# Patient Record
Sex: Male | Born: 1995 | Race: White | Hispanic: No | Marital: Single | Smoking: Never smoker
Health system: Southern US, Community
[De-identification: ages and names within clinical notes are randomized; demographics above are authoritative.]

## PROBLEM LIST (undated history)

## (undated) DIAGNOSIS — S62109A Fracture of unspecified carpal bone, unspecified wrist, initial encounter for closed fracture: Secondary | ICD-10-CM

---
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
## 2014-02-16 ENCOUNTER — Encounter (HOSPITAL_COMMUNITY): Payer: Self-pay | Admitting: Emergency Medicine

## 2014-02-16 ENCOUNTER — Emergency Department (HOSPITAL_COMMUNITY)
Admission: EM | Admit: 2014-02-16 | Discharge: 2014-02-17 | Disposition: A | Discharge status: Home / Self Care | Payer: Managed Care, Other (non HMO) | Attending: Emergency Medicine | Admitting: Emergency Medicine

## 2014-02-16 DIAGNOSIS — S3022XA Contusion of scrotum and testes, initial encounter: Secondary | ICD-10-CM

## 2014-02-16 DIAGNOSIS — N501 Vascular disorders of male genital organs: Secondary | ICD-10-CM | POA: Insufficient documentation

## 2014-02-16 DIAGNOSIS — Z791 Long term (current) use of non-steroidal anti-inflammatories (NSAID): Secondary | ICD-10-CM | POA: Insufficient documentation

## 2014-02-16 DIAGNOSIS — Z87828 Personal history of other (healed) physical injury and trauma: Secondary | ICD-10-CM | POA: Insufficient documentation

## 2014-02-16 NOTE — ED Notes (Signed)
Pt reports having a bruise to scrotum he noticed 4 days ago. Pt denies any injury & no urinary symptoms.

## 2014-02-16 NOTE — ED Provider Notes (Signed)
CSN: [id number]     Arrival date & time 02/16/14  2339 History   This chart was scribed for Geoffery Lyons, MD, by Yevette Edwards, ED Scribe. This patient was seen in room APA18/APA18 and the patient's care was started at 11:59 PM.  First MD Initiated Contact with Patient 02/16/14 2355     Chief Complaint  Patient presents with  . Groin Swelling   The history is provided by the patient. No language interpreter was used.   HPI Comments: Isaac Ortiz is a 18 y.o. male who presents to the Emergency Department complaining of a bruise to his testicle which was first noticed three days ago. He denies falls or injuries. He denies a h/o similar symptoms. He denies dysuria, testicular pain, or scrotal pain.  The pt visited his "back doctor" yesterday for the same symptoms without resolution; the pt takes Vicodin and naproxen for back pain. He lifts frequently at work.   Past Medical History  Diagnosis Date  . Broken wrist    History reviewed. No pertinent past surgical history. No family history on file. History  Substance Use Topics  . Smoking status: Never Smoker   . Smokeless tobacco: Not on file  . Alcohol Use: No    Review of Systems  Genitourinary: Positive for scrotal swelling. Negative for penile pain and testicular pain.  All other systems reviewed and are negative.    Allergies  Review of patient's allergies indicates no known allergies.  Home Medications   Current Outpatient Rx  Name  Route  Sig  Dispense  Refill  . HYDROcodone-acetaminophen (NORCO/VICODIN) 5-325 MG per tablet   Oral   Take 1 tablet by mouth every 4 (four) hours as needed for moderate pain.         . naproxen (NAPROSYN) 250 MG tablet   Oral   Take by mouth 2 (two) times daily with a meal.          Triage Vitals:  There were no vitals taken for this visit. Physical Exam  Nursing note and vitals reviewed. Constitutional: He is oriented to person, place, and time. He appears well-developed and  well-nourished. No distress.  HENT:  Head: Normocephalic and atraumatic.  Eyes: EOM are normal.  Neck: Neck supple. No tracheal deviation present.  Cardiovascular: Normal rate.   Pulmonary/Chest: Effort normal. No respiratory distress.  Genitourinary:  Testicles normal. Not twisted or torsed. The testicles are freely mobile.  The scrotum is noted to have an ecchymotic area on the anterior aspect. There is no significant swelling.   Musculoskeletal: Normal range of motion.  Neurological: He is alert and oriented to person, place, and time.  Skin: Skin is warm and dry.  Psychiatric: He has a normal mood and affect. His behavior is normal.    ED Course  Procedures (including critical care time)  DIAGNOSTIC STUDIES: COORDINATION OF CARE:  12:04 AM- Discussed treatment plan with patient, which includes a follow-up for an ultra-sound, and the patient agreed to the plan.   Labs Review Labs Reviewed - No data to display Imaging Review No results found.    MDM   Final diagnoses:  None    Patient presents with a bruise to his scrotum in the absence of any injury or trauma. There is no evidence for testicular torsion or hernia or other emergent process.. The genitalia exam is unremarkable with the exception of an ecchymotic area on the anterior aspect of the scrotum. Will set up an ultrasound as an outpatient. He  is to followup when necessary for any problems.    I personally performed the services described in this documentation, which was scribed in my presence. The recorded information has been reviewed and is accurate.    Geoffery Lyonsouglas Ellen Goris, MD 02/17/14 [phone]

## 2014-02-17 ENCOUNTER — Ambulatory Visit (HOSPITAL_COMMUNITY): Admit: 2014-02-17 | Payer: Managed Care, Other (non HMO)

## 2014-02-17 NOTE — Discharge Instructions (Signed)
Return to Radiology for an ultrasound at the arranged time.    Return to the ER if you develop severe pain, fever, or increased swelling.   Hematoma A hematoma is a collection of blood under the skin, in an organ, in a body space, in a joint space, or in other tissue. The blood can clot to form a lump that you can see and feel. The lump is often firm and may sometimes become sore and tender. Most hematomas get better in a few days to weeks. However, some hematomas may be serious and require medical care. Hematomas can range in size from very small to very large. CAUSES  A hematoma can be caused by a blunt or penetrating injury. It can also be caused by spontaneous leakage from a blood vessel under the skin. Spontaneous leakage from a blood vessel is more likely to occur in older people, especially those taking blood thinners. Sometimes, a hematoma can develop after certain medical procedures. SIGNS AND SYMPTOMS   A firm lump on the body.  Possible pain and tenderness in the area.  Bruising.Blue, dark blue, purple-red, or yellowish skin may appear at the site of the hematoma if the hematoma is close to the surface of the skin. For hematomas in deeper tissues or body spaces, the signs and symptoms may be subtle. For example, an intra-abdominal hematoma may cause abdominal pain, weakness, fainting, and shortness of breath. An intracranial hematoma may cause a headache or symptoms such as weakness, trouble speaking, or a change in consciousness. DIAGNOSIS  A hematoma can usually be diagnosed based on your medical history and a physical exam. Imaging tests may be needed if your health care provider suspects a hematoma in deeper tissues or body spaces, such as the abdomen, head, or chest. These tests may include ultrasonography or a CT scan.  TREATMENT  Hematomas usually go away on their own over time. Rarely does the blood need to be drained out of the body. Large hematomas or those that may affect  vital organs will sometimes need surgical drainage or monitoring. HOME CARE INSTRUCTIONS   Apply ice to the injured area:   Put ice in a plastic bag.   Place a towel between your skin and the bag.   Leave the ice on for 20 minutes, 2 3 times a day for the first 1 to 2 days.   After the first 2 days, switch to using warm compresses on the hematoma.   Elevate the injured area to help decrease pain and swelling. Wrapping the area with an elastic bandage may also be helpful. Compression helps to reduce swelling and promotes shrinking of the hematoma. Make sure the bandage is not wrapped too tight.   If your hematoma is on a lower extremity and is painful, crutches may be helpful for a couple days.   Only take over-the-counter or prescription medicines as directed by your health care provider. SEEK IMMEDIATE MEDICAL CARE IF:   You have increasing pain, or your pain is not controlled with medicine.   You have a fever.   You have worsening swelling or discoloration.   Your skin over the hematoma breaks or starts bleeding.   Your hematoma is in your chest or abdomen and you have weakness, shortness of breath, or a change in consciousness.  Your hematoma is on your scalp (caused by a fall or injury) and you have a worsening headache or a change in alertness or consciousness. MAKE SURE YOU:   Understand these instructions.  Will watch your condition.  Will get help right away if you are not doing well or get worse. Document Released: 07/28/2004 Document Revised: 08/16/2013 Document Reviewed: 05/24/2013 [facility] Patient Information 2014 [place], [place].

## 2014-02-17 NOTE — ED Notes (Signed)
Per radiology, pt did no show up for ultra sound.  Attempted to call pt but no answer and unable to leave voice mail.

## 2014-02-17 NOTE — ED Notes (Signed)
Patient with no complaints at this time. Respirations even and unlabored. Skin warm/dry. Discharge instructions reviewed with patient at this time. Patient given opportunity to voice concerns/ask questions. Patient discharged at this time and left Emergency Department with steady gait.   

## 2015-02-18 ENCOUNTER — Emergency Department (HOSPITAL_COMMUNITY): Payer: Managed Care, Other (non HMO)

## 2015-02-18 ENCOUNTER — Emergency Department (HOSPITAL_COMMUNITY)
Admission: EM | Admit: 2015-02-18 | Discharge: 2015-02-18 | Discharge status: Against Medical Advice | Payer: Managed Care, Other (non HMO) | Attending: Emergency Medicine | Admitting: Emergency Medicine

## 2015-02-18 ENCOUNTER — Encounter (HOSPITAL_COMMUNITY): Payer: Self-pay | Admitting: Emergency Medicine

## 2015-02-18 DIAGNOSIS — R079 Chest pain, unspecified: Secondary | ICD-10-CM | POA: Insufficient documentation

## 2015-02-18 NOTE — ED Notes (Signed)
Pt called for room, informed by registration that pt has left.

## 2015-02-18 NOTE — ED Notes (Signed)
Pt c/o bilateral rib pain and over all body aches. Pt denies any injury.

## 2015-02-23 ENCOUNTER — Encounter (HOSPITAL_COMMUNITY): Payer: Self-pay | Admitting: Emergency Medicine

## 2015-02-23 ENCOUNTER — Emergency Department (HOSPITAL_COMMUNITY)
Admission: EM | Admit: 2015-02-23 | Discharge: 2015-02-23 | Disposition: A | Discharge status: Home / Self Care | Payer: Managed Care, Other (non HMO) | Attending: Emergency Medicine | Admitting: Emergency Medicine

## 2015-02-23 DIAGNOSIS — R197 Diarrhea, unspecified: Secondary | ICD-10-CM | POA: Insufficient documentation

## 2015-02-23 DIAGNOSIS — R1084 Generalized abdominal pain: Secondary | ICD-10-CM | POA: Diagnosis not present

## 2015-02-23 DIAGNOSIS — R109 Unspecified abdominal pain: Secondary | ICD-10-CM

## 2015-02-23 DIAGNOSIS — Z8781 Personal history of (healed) traumatic fracture: Secondary | ICD-10-CM | POA: Insufficient documentation

## 2015-02-23 LAB — COMPREHENSIVE METABOLIC PANEL
ALT: 22 U/L (ref 0–53)
AST: 28 U/L (ref 0–37)
Albumin: 4.8 g/dL (ref 3.5–5.2)
Alkaline Phosphatase: 67 U/L (ref 39–117)
Anion gap: 7 (ref 5–15)
BILIRUBIN TOTAL: 0.6 mg/dL (ref 0.3–1.2)
BUN: 18 mg/dL (ref 6–23)
CALCIUM: 8.9 mg/dL (ref 8.4–10.5)
CHLORIDE: 105 mmol/L (ref 96–112)
CO2: 28 mmol/L (ref 19–32)
CREATININE: 0.82 mg/dL (ref 0.50–1.35)
GFR calc Af Amer: >90 mL/min (ref >90)
GLUCOSE: 107 mg/dL — AB (ref 70–99)
POTASSIUM: 4 mmol/L (ref 3.5–5.1)
Sodium: 140 mmol/L (ref 135–145)
TOTAL PROTEIN: 7.5 g/dL (ref 6.0–8.3)

## 2015-02-23 LAB — CBC WITH DIFFERENTIAL/PLATELET
BASOS PCT: 1 % (ref 0–1)
Basophils Absolute: 0 10*3/uL (ref 0.0–0.1)
Eosinophils Absolute: 0.2 10*3/uL (ref 0.0–0.7)
Eosinophils Relative: 5 % (ref 0–5)
HCT: 42.6 % (ref 39.0–52.0)
Hemoglobin: 14.6 g/dL (ref 13.0–17.0)
Lymphocytes Relative: 33 % (ref 12–46)
Lymphs Abs: 1.3 10*3/uL (ref 0.7–4.0)
MCH: 30.2 pg (ref 26.0–34.0)
MCHC: 34.3 g/dL (ref 30.0–36.0)
MCV: 88.2 fL (ref 78.0–100.0)
Monocytes Absolute: 0.7 10*3/uL (ref 0.1–1.0)
Monocytes Relative: 16 % — ABNORMAL HIGH (ref 3–12)
NEUTROS ABS: 1.8 10*3/uL (ref 1.7–7.7)
Neutrophils Relative %: 45 % (ref 43–77)
PLATELETS: 161 10*3/uL (ref 150–400)
RBC: 4.83 MIL/uL (ref 4.22–5.81)
RDW: 12.6 % (ref 11.5–15.5)
WBC: 4 10*3/uL (ref 4.0–10.5)

## 2015-02-23 LAB — URINALYSIS, ROUTINE W REFLEX MICROSCOPIC
Bilirubin Urine: NEGATIVE
GLUCOSE, UA: NEGATIVE mg/dL
Hgb urine dipstick: NEGATIVE
Ketones, ur: NEGATIVE mg/dL
LEUKOCYTES UA: NEGATIVE
Nitrite: NEGATIVE
PROTEIN: NEGATIVE mg/dL
Specific Gravity, Urine: 1.025 (ref 1.005–1.030)
Urobilinogen, UA: 0.2 mg/dL (ref 0.0–1.0)
pH: 6 (ref 5.0–8.0)

## 2015-02-23 LAB — LIPASE, BLOOD: Lipase: 36 U/L (ref 11–59)

## 2015-02-23 NOTE — ED Provider Notes (Signed)
CSN: [id number]     Arrival date & time 02/23/15  1123 History  This chart was scribed for Joya Gaskins, MD by Richarda Overlie, ED Scribe. This patient was seen in room APA08/APA08 and the patient's care was started 12:29 PM.  Chief Complaint  Patient presents with  . Abdominal Pain   Patient is a 19 y.o. male presenting with abdominal pain. The history is provided by the patient. No language interpreter was used.  Abdominal Pain Pain location:  Generalized Pain quality: dull   Pain severity:  Mild Duration:  1 week Timing:  Constant Progression:  Waxing and waning Associated symptoms: diarrhea   Associated symptoms: no chest pain, no dysuria, no fever, no nausea, no shortness of breath and no vomiting    HPI Comments: Isaac Ortiz is a 19 y.o. male who presents to the Emergency Department complaining of generalized abdominal pain for the last week. Pt states that his pain has been gradually worsening and says he had to leave work today. Pt reports that he is prescribed hydrocodone for his chronic back pain. He states he has not taken his hydrocodone medication for the last 5 to 6 days. Pt states that he has had some diarrhea but says he thinks this is due to his diet. Pt reports no surgical history. He denies fever, vomiting, CP, SOB, dysuria, bloody stool and black stool.   Past Medical History  Diagnosis Date  . Broken wrist    History reviewed. No pertinent past surgical history. History reviewed. No pertinent family history. History  Substance Use Topics  . Smoking status: Never Smoker   . Smokeless tobacco: Never Used  . Alcohol Use: No    Review of Systems  Constitutional: Negative for fever.  Respiratory: Negative for shortness of breath.   Cardiovascular: Negative for chest pain.  Gastrointestinal: Positive for abdominal pain and diarrhea. Negative for nausea, vomiting and blood in stool.  Genitourinary: Negative for dysuria.  All other systems reviewed and are  negative.     Allergies  Review of patient's allergies indicates no known allergies.  Home Medications   Prior to Admission medications   Not on File   BP 118/76 mmHg  Pulse 82  Temp(Src) 98.4 F (36.9 C) (Oral)  Resp 18  Ht  (1.93 m)  Wt 200 lb (90.719 kg)  BMI 24.35 kg/m2  SpO2 100% Physical Exam CONSTITUTIONAL: Well developed/well nourished HEAD: Normocephalic/atraumatic EYES: EOMI/PERRL. No icterus noted.  ENMT: Mucous membranes moist NECK: supple no meningeal signs SPINE/BACK:entire spine nontender CV: S1/S2 noted, no murmurs/rubs/gallops noted LUNGS: Lungs are clear to auscultation bilaterally, no apparent distress ABDOMEN: soft, nontender, no rebound or guarding, bowel sounds noted throughout abdomen GU:no cva tenderness NEURO: Pt is awake/alert/appropriate, moves all extremitiesx4.  No facial droop.   EXTREMITIES: pulses normal/equal, full ROM SKIN: warm, color normal PSYCH: no abnormalities of mood noted, alert and oriented to situation  ED Course  Procedures   DIAGNOSTIC STUDIES: Oxygen Saturation is 100% on RA, normal by my interpretation.    COORDINATION OF CARE: 12:35 PM Discussed treatment plan with pt at bedside and pt agreed to plan. Pt well appearing No vomiting here No focal abdominal tenderness I doubt appendicitis or other acute abdominal emergency We discussed need for outpatient followup and strict return precautions  Labs Review Labs Reviewed  CBC WITH DIFFERENTIAL/PLATELET - Abnormal; Notable for the following:    Monocytes Relative 16 (*)    All other components within normal limits  COMPREHENSIVE METABOLIC  PANEL - Abnormal; Notable for the following:    Glucose, Bld 107 (*)    All other components within normal limits  URINALYSIS, ROUTINE W REFLEX MICROSCOPIC  LIPASE, BLOOD     MDM   Final diagnoses:  Abdominal pain, unspecified abdominal location    Nursing notes including past medical history and social history  reviewed and considered in documentation Labs/vital reviewed myself and considered during evaluation  I personally performed the services described in this documentation, which was scribed in my presence. The recorded information has been reviewed and is accurate.        [name], MD 02/23/15 [phone]

## 2015-02-23 NOTE — Discharge Instructions (Signed)

## 2015-02-23 NOTE — ED Notes (Addendum)
Patient c/o generalized abd pain x1 week. Denies any nausea, vomiting, diarrhea, fevers, or urinary symptoms. Per patient pain is constantly dull with intermittently sever pain. Per patient last normal BM today-no blood noted.

## 2015-02-23 NOTE — ED Notes (Signed)
Pt states he is unable to provide urine sample as this time.

## 2015-12-19 ENCOUNTER — Other Ambulatory Visit (HOSPITAL_COMMUNITY)
Admission: RE | Admit: 2015-12-19 | Discharge: 2015-12-19 | Disposition: A | Discharge status: Home / Self Care | Payer: 59 | Source: Ambulatory Visit | Attending: Internal Medicine | Admitting: Internal Medicine

## 2015-12-19 DIAGNOSIS — M545 Low back pain: Secondary | ICD-10-CM | POA: Insufficient documentation

## 2015-12-19 DIAGNOSIS — R51 Headache: Secondary | ICD-10-CM | POA: Diagnosis present

## 2015-12-19 LAB — HEPATIC FUNCTION PANEL
ALBUMIN: 4.6 g/dL (ref 3.5–5.0)
ALT: 25 U/L (ref 17–63)
AST: 20 U/L (ref 15–41)
Alkaline Phosphatase: 56 U/L (ref 38–126)
Bilirubin, Direct: <0.1 mg/dL — ABNORMAL LOW (ref 0.1–0.5)
Total Bilirubin: 0.6 mg/dL (ref 0.3–1.2)
Total Protein: 7.2 g/dL (ref 6.5–8.1)

## 2015-12-19 LAB — BASIC METABOLIC PANEL
Anion gap: 5 (ref 5–15)
BUN: 16 mg/dL (ref 6–20)
CALCIUM: 9.5 mg/dL (ref 8.9–10.3)
CHLORIDE: 103 mmol/L (ref 101–111)
CO2: 33 mmol/L — AB (ref 22–32)
Creatinine, Ser: 0.84 mg/dL (ref 0.61–1.24)
GFR calc non Af Amer: >60 mL/min (ref >60)
GLUCOSE: 86 mg/dL (ref 65–99)
Potassium: 4.1 mmol/L (ref 3.5–5.1)
Sodium: 141 mmol/L (ref 135–145)

## 2015-12-19 LAB — CBC WITH DIFFERENTIAL/PLATELET
Basophils Absolute: 0 10*3/uL (ref 0.0–0.1)
Basophils Relative: 1 %
EOS PCT: 9 %
Eosinophils Absolute: 0.5 10*3/uL (ref 0.0–0.7)
HEMATOCRIT: 43 % (ref 39.0–52.0)
Hemoglobin: 14.6 g/dL (ref 13.0–17.0)
Lymphocytes Relative: 33 %
Lymphs Abs: 1.6 10*3/uL (ref 0.7–4.0)
MCH: 30.1 pg (ref 26.0–34.0)
MCHC: 34 g/dL (ref 30.0–36.0)
MCV: 88.7 fL (ref 78.0–100.0)
MONO ABS: 0.7 10*3/uL (ref 0.1–1.0)
MONOS PCT: 14 %
Neutro Abs: 2.1 10*3/uL (ref 1.7–7.7)
Neutrophils Relative %: 43 %
PLATELETS: 160 10*3/uL (ref 150–400)
RBC: 4.85 MIL/uL (ref 4.22–5.81)
RDW: 12.5 % (ref 11.5–15.5)
WBC: 4.9 10*3/uL (ref 4.0–10.5)

## 2015-12-19 LAB — LIPID PANEL
CHOL/HDL RATIO: 3.1 ratio
CHOLESTEROL: 178 mg/dL (ref 0–200)
HDL: 58 mg/dL (ref >40)
LDL Cholesterol: 96 mg/dL (ref 0–99)
Triglycerides: 122 mg/dL (ref <150)
VLDL: 24 mg/dL (ref 0–40)

## 2016-01-30 ENCOUNTER — Telehealth: Payer: Self-pay | Admitting: Orthopaedic Surgery

## 2016-01-30 NOTE — Telephone Encounter (Signed)
Patient called in requesting refill for Hydrocodone 5/325mg 

## 2016-01-31 MED ORDER — HYDROCODONE-ACETAMINOPHEN 5-325 MG PO TABS: 1.0000 | ORAL_TABLET | ORAL | Status: DC | PRN

## 2016-01-31 NOTE — Telephone Encounter (Signed)
Rx printed

## 2016-02-05 NOTE — Telephone Encounter (Signed)
Patient picked up prescription.

## 2016-02-13 ENCOUNTER — Ambulatory Visit: Payer: Managed Care, Other (non HMO) | Admitting: Orthopaedic Surgery

## 2016-02-13 ENCOUNTER — Encounter: Payer: Self-pay | Admitting: Orthopedic Surgery

## 2016-03-04 ENCOUNTER — Telehealth: Payer: Self-pay | Admitting: Orthopaedic Surgery

## 2016-03-04 NOTE — Telephone Encounter (Signed)
Patient is requesting refill on Hydrocodone 5/325mg   Qty 120 Tablets

## 2016-03-05 MED ORDER — HYDROCODONE-ACETAMINOPHEN 5-325 MG PO TABS: 1.0000 | ORAL_TABLET | ORAL | Status: DC | PRN

## 2016-03-05 NOTE — Telephone Encounter (Signed)
Rx printed

## 2016-04-01 ENCOUNTER — Ambulatory Visit (INDEPENDENT_AMBULATORY_CARE_PROVIDER_SITE_OTHER): Payer: Managed Care, Other (non HMO) | Admitting: Orthopaedic Surgery

## 2016-04-01 ENCOUNTER — Encounter: Payer: Self-pay | Admitting: Orthopedic Surgery

## 2016-04-01 VITALS — BP 146/69 | HR 77 | Temp 98.1°F | Ht 75.0 in | Wt 228.0 lb

## 2016-04-01 DIAGNOSIS — M546 Pain in thoracic spine: Secondary | ICD-10-CM

## 2016-04-01 MED ORDER — HYDROCODONE-ACETAMINOPHEN 5-325 MG PO TABS: 1.0000 | ORAL_TABLET | ORAL | Status: DC | PRN

## 2016-04-01 NOTE — Progress Notes (Signed)
Patient ZO:XWRUEAV:Isaac Ortiz, male DOB:03/19/1996, 20 y.o. WUJ:[id number]  Chief Complaint  Patient presents with  . Follow-up    Thoracic Spine    HPI  Isaac Ortiz is a 20 y.o. male who has chronic thoracic back pain. He has no trauma.  He has no paresthesias.  He is doing his exercises and is active.  He is working.   Back Pain This is a chronic problem. The current episode started more than 1 year ago. The problem occurs every several days. The problem has been waxing and waning since onset. The pain is present in the thoracic spine. The quality of the pain is described as aching. The pain does not radiate. The pain is at a severity of 2/10. The pain is mild. The symptoms are aggravated by bending and twisting. Pertinent negatives include no chest pain. He has tried analgesics, heat, home exercises, ice and NSAIDs for the symptoms. The treatment provided moderate relief.    Body mass index is 28.5 kg/(m^2).  Review of Systems  HENT: Negative for congestion.   Respiratory: Negative for cough and shortness of breath.   Cardiovascular: Negative for chest pain and leg swelling.  Endocrine: Negative for cold intolerance.  Musculoskeletal: Positive for back pain and arthralgias.  Allergic/Immunologic: Negative for environmental allergies.  All other systems reviewed and are negative.   Past Medical History  Diagnosis Date  . Broken wrist     No past surgical history on file.  No family history on file.  Social History Social History  Substance Use Topics  . Smoking status: Never Smoker   . Smokeless tobacco: Never Used  . Alcohol Use: No    No Known Allergies  Current Outpatient Prescriptions  Medication Sig Dispense Refill  . HYDROcodone-acetaminophen (NORCO/VICODIN) 5-325 MG tablet Take 1 tablet by mouth every 4 (four) hours as needed for moderate pain (Must last 30 days.  Do not take and drive a car or use machinery.). 120 tablet 0  . naproxen (NAPROSYN) 500 MG tablet  Take 500 mg by mouth 2 (two) times daily with a meal.     No current facility-administered medications for this visit.     Physical Exam  Blood pressure 146/69, pulse 77, temperature 98.1 F (36.7 C), height 6\' 3"  (1.905 m), weight 228 lb (103.42 kg).  Constitutional: overall normal hygiene, normal nutrition, well developed, normal grooming, normal body habitus. Assistive device:none  Musculoskeletal: gait and station Limp none, muscle tone and strength are normal, no tremors or atrophy is present.  .  Neurological: coordination overall normal.  Deep tendon reflex/nerve stretch intact.  Sensation normal.  Cranial nerves II-XII intact.   Skin:   normal overall no scars, lesions, ulcers or rashes. No psoriasis.  Psychiatric: Alert and oriented x 3.  Recent memory intact, remote memory unclear.  Normal mood and affect. Well groomed.  Good eye contact.  Cardiovascular: overall no swelling, no varicosities, no edema bilaterally, normal temperatures of the legs and arms, no clubbing, cyanosis and good capillary refill.  Lymphatic: palpation is normal. Spine/Pelvis examination:  Inspection:  Overall, sacoiliac joint benign and hips nontender; without crepitus or defects.   Thoracic spine inspection: Alignment normal without kyphosis present   Lumbar spine inspection:  Alignment  with normal lumbar lordosis, without scoliosis apparent.   Thoracic spine palpation:  with tenderness of spinal processes   Lumbar spine palpation: without tenderness of lumbar area; without tightness of lumbar muscles    Range of Motion:   Lumbar flexion, forward  flexion is 45  without pain or tenderness    Lumbar extension is 10  without pain or tenderness   Left lateral bend is Normal  without pain or tenderness   Right lateral bend is Normal without pain or tenderness   Straight leg raising is Normal   Strength & tone: Normal   Stability overall normal stability    The patient has been educated  about the nature of the problem(s) and counseled on treatment options.  The patient appeared to understand what I have discussed and is in agreement with it.  Encounter Diagnosis  Name Primary?  . Midline thoracic back pain Yes    PLAN Call if any problems.  Precautions discussed.  Continue current medications.   Return to clinic 3 months

## 2016-04-16 ENCOUNTER — Emergency Department (HOSPITAL_COMMUNITY)
Admission: EM | Admit: 2016-04-16 | Discharge: 2016-04-16 | Disposition: A | Discharge status: Home / Self Care | Payer: 59 | Attending: Emergency Medicine | Admitting: Emergency Medicine

## 2016-04-16 ENCOUNTER — Encounter (HOSPITAL_COMMUNITY): Payer: Self-pay

## 2016-04-16 ENCOUNTER — Emergency Department (HOSPITAL_COMMUNITY): Payer: 59

## 2016-04-16 DIAGNOSIS — Z791 Long term (current) use of non-steroidal anti-inflammatories (NSAID): Secondary | ICD-10-CM | POA: Diagnosis not present

## 2016-04-16 DIAGNOSIS — Y999 Unspecified external cause status: Secondary | ICD-10-CM | POA: Insufficient documentation

## 2016-04-16 DIAGNOSIS — Y939 Activity, unspecified: Secondary | ICD-10-CM | POA: Diagnosis not present

## 2016-04-16 DIAGNOSIS — S93402A Sprain of unspecified ligament of left ankle, initial encounter: Secondary | ICD-10-CM | POA: Diagnosis not present

## 2016-04-16 DIAGNOSIS — Z79899 Other long term (current) drug therapy: Secondary | ICD-10-CM | POA: Insufficient documentation

## 2016-04-16 DIAGNOSIS — Y929 Unspecified place or not applicable: Secondary | ICD-10-CM | POA: Diagnosis not present

## 2016-04-16 DIAGNOSIS — M79672 Pain in left foot: Secondary | ICD-10-CM | POA: Diagnosis present

## 2016-04-16 DIAGNOSIS — X58XXXA Exposure to other specified factors, initial encounter: Secondary | ICD-10-CM | POA: Insufficient documentation

## 2016-04-16 MED ORDER — IBUPROFEN 800 MG PO TABS: 800.0000 mg | ORAL_TABLET | Freq: Three times a day (TID) | ORAL | Status: DC

## 2016-04-16 NOTE — ED Provider Notes (Signed)
CSN: [id number]     Arrival date & time 04/16/16  1353 History   First MD Initiated Contact with Patient 04/16/16 1536     Chief Complaint  Patient presents with  . Foot Pain     (Consider location/radiation/quality/duration/timing/severity/associated sxs/prior Treatment) Patient is a 20 y.o. male presenting with lower extremity pain. The history is provided by the patient. No language interpreter was used.  Foot Pain This is a new problem. The current episode started today. The problem occurs constantly. The problem has been unchanged. Associated symptoms include joint swelling. Nothing aggravates the symptoms. He has tried nothing for the symptoms. The treatment provided moderate relief.    Past Medical History  Diagnosis Date  . Broken wrist    History reviewed. No pertinent past surgical history. No family history on file. Social History  Substance Use Topics  . Smoking status: Never Smoker   . Smokeless tobacco: Never Used  . Alcohol Use: No    Review of Systems  Musculoskeletal: Positive for joint swelling.  All other systems reviewed and are negative.     Allergies  Review of patient's allergies indicates no known allergies.  Home Medications   Prior to Admission medications   Medication Sig Start Date End Date Taking? Authorizing Provider  cetirizine (ZYRTEC) 10 MG tablet Take 10 mg by mouth daily as needed for allergies.   Yes Historical Provider, MD  HYDROcodone-acetaminophen (NORCO/VICODIN) 5-325 MG tablet Take 1 tablet by mouth every 4 (four) hours as needed for moderate pain (Must last 30 days.  Do not take and drive a car or use machinery.). 04/01/16  Yes Darreld McleanWayne Keeling, MD  naproxen (NAPROSYN) 500 MG tablet Take 500 mg by mouth 2 (two) times daily with a meal.   Yes Historical Provider, MD  ibuprofen (ADVIL,MOTRIN) 800 MG tablet Take 1 tablet (800 mg total) by mouth 3 (three) times daily. 04/16/16   Lonia SkinnerLeslie K Sofia, PA-C   BP 128/75 mmHg  Pulse 82  Temp(Src)  98.6 F (37 C)  Resp 18  SpO2 99% Physical Exam  Constitutional: He appears well-developed and well-nourished.  HENT:  Head: Normocephalic and atraumatic.  Right Ear: External ear normal.  Left Ear: External ear normal.  Nose: Nose normal.  Mouth/Throat: Oropharynx is clear and moist.  Eyes: Pupils are equal, round, and reactive to light.  Neck: Normal range of motion.  Cardiovascular: Normal rate.   Pulmonary/Chest: Effort normal.  Abdominal: Soft.  Skin: Skin is warm.  Psychiatric: He has a normal mood and affect.  Nursing note and vitals reviewed.   ED Course  Procedures (including critical care time) Labs Review Labs Reviewed - No data to display  Imaging Review Dg Foot Complete Left  04/16/2016  CLINICAL DATA:  Left foot pain, no known injury, initial encounter EXAM: LEFT FOOT - COMPLETE 3+ VIEW COMPARISON:  06/14/2011 FINDINGS: Previously seen fifth proximal phalangeal fracture has healed in the interval from the prior exam. No new fracture or dislocation is seen. No soft tissue abnormality is noted. IMPRESSION: No acute abnormality seen. Electronically Signed   By: Alcide CleverMark  Lukens M.D.   On: 04/16/2016 14:34   I have personally reviewed and evaluated these images and lab results as part of my medical decision-making.   EKG Interpretation None      MDM   Final diagnoses:  Ankle sprain, left, initial encounter    aso Crutches An After Visit Summary was printed and given to the patient.    Elson AreasLeslie K Sofia, PA-C 04/16/16  1609  Bethann Berkshire, MD 04/17/16 [phone]

## 2016-04-16 NOTE — Discharge Instructions (Signed)
Ankle Sprain  An ankle sprain is an injury to the strong, fibrous tissues (ligaments) that hold the bones of your ankle joint together.   CAUSES  An ankle sprain is usually caused by a fall or by twisting your ankle. Ankle sprains most commonly occur when you step on the outer edge of your foot, and your ankle turns inward. People who participate in sports are more prone to these types of injuries.   SYMPTOMS    Pain in your ankle. The pain may be present at rest or only when you are trying to stand or walk.   Swelling.   Bruising. Bruising may develop immediately or within 1 to 2 days after your injury.   Difficulty standing or walking, particularly when turning corners or changing directions.  DIAGNOSIS   Your caregiver will ask you details about your injury and perform a physical exam of your ankle to determine if you have an ankle sprain. During the physical exam, your caregiver will press on and apply pressure to specific areas of your foot and ankle. Your caregiver will try to move your ankle in certain ways. An X-ray exam may be done to be sure a bone was not broken or a ligament did not separate from one of the bones in your ankle (avulsion fracture).   TREATMENT   Certain types of braces can help stabilize your ankle. Your caregiver can make a recommendation for this. Your caregiver may recommend the use of medicine for pain. If your sprain is severe, your caregiver may refer you to a surgeon who helps to restore function to parts of your skeletal system (orthopedist) or a physical therapist.  HOME CARE INSTRUCTIONS    Apply ice to your injury for 1-2 days or as directed by your caregiver. Applying ice helps to reduce inflammation and pain.    Put ice in a plastic bag.    Place a towel between your skin and the bag.    Leave the ice on for 15-20 minutes at a time, every 2 hours while you are awake.   Only take over-the-counter or prescription medicines for pain, discomfort, or fever as directed by  your caregiver.   Elevate your injured ankle above the level of your heart as much as possible for 2-3 days.   If your caregiver recommends crutches, use them as instructed. Gradually put weight on the affected ankle. Continue to use crutches or a cane until you can walk without feeling pain in your ankle.   If you have a plaster splint, wear the splint as directed by your caregiver. Do not rest it on anything harder than a pillow for the first 24 hours. Do not put weight on it. Do not get it wet. You may take it off to take a shower or bath.   You may have been given an elastic bandage to wear around your ankle to provide support. If the elastic bandage is too tight (you have numbness or tingling in your foot or your foot becomes cold and blue), adjust the bandage to make it comfortable.   If you have an air splint, you may blow more air into it or let air out to make it more comfortable. You may take your splint off at night and before taking a shower or bath. Wiggle your toes in the splint several times per day to decrease swelling.  SEEK MEDICAL CARE IF:    You have rapidly increasing bruising or swelling.   Your toes feel   extremely cold or you lose feeling in your foot.   Your pain is not relieved with medicine.  SEEK IMMEDIATE MEDICAL CARE IF:   Your toes are numb or blue.   You have severe pain that is increasing.  MAKE SURE YOU:    Understand these instructions.   Will watch your condition.   Will get help right away if you are not doing well or get worse.     This information is not intended to replace advice given to you by your health care provider. Make sure you discuss any questions you have with your health care provider.     Document Released: 12/14/2005 Document Revised: 01/04/2015 Document Reviewed: 12/26/2011  Elsevier Interactive Patient Education 2016 [employer]
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
## 2016-04-29 ENCOUNTER — Telehealth: Payer: Self-pay | Admitting: Orthopaedic Surgery

## 2016-04-29 MED ORDER — HYDROCODONE-ACETAMINOPHEN 5-325 MG PO TABS: 1.0000 | ORAL_TABLET | ORAL | Status: DC | PRN

## 2016-04-29 NOTE — Telephone Encounter (Signed)
Rx done. 

## 2016-04-29 NOTE — Telephone Encounter (Signed)
Hydrocodone-Acetaminophen  5/325mg  Qty 120 Tablets °

## 2016-05-27 ENCOUNTER — Telehealth: Payer: Self-pay | Admitting: Orthopaedic Surgery

## 2016-05-27 MED ORDER — HYDROCODONE-ACETAMINOPHEN 5-325 MG PO TABS: 1.0000 | ORAL_TABLET | ORAL | Status: DC | PRN

## 2016-05-27 NOTE — Telephone Encounter (Signed)
Rx done. 

## 2016-05-27 NOTE — Telephone Encounter (Signed)
Hydrocodone-Acetaminophen  5/325mg  Qty 120 Tablets °

## 2016-07-01 ENCOUNTER — Ambulatory Visit: Payer: 59 | Admitting: Orthopaedic Surgery

## 2016-07-15 ENCOUNTER — Encounter: Payer: Self-pay | Admitting: Orthopaedic Surgery

## 2016-07-15 ENCOUNTER — Ambulatory Visit (INDEPENDENT_AMBULATORY_CARE_PROVIDER_SITE_OTHER): Payer: Managed Care, Other (non HMO) | Admitting: Orthopaedic Surgery

## 2016-07-15 VITALS — BP 116/73 | HR 79 | Temp 97.9°F | Ht 75.0 in | Wt 243.0 lb

## 2016-07-15 DIAGNOSIS — M546 Pain in thoracic spine: Secondary | ICD-10-CM

## 2016-07-15 MED ORDER — HYDROCODONE-ACETAMINOPHEN 5-325 MG PO TABS: 1.0000 | ORAL_TABLET | ORAL | Status: DC | PRN

## 2016-07-15 NOTE — Progress Notes (Signed)
Patient Isaac Ortiz, male DOB:08/08/1996, 20 y.o. [id number]  Chief Complaint  Patient presents with  . Follow-up    back pain    HPI  Isaac Ortiz is a 20 y.o. male who has chronic lower back pain with no paresthesias.  He is doing his exercises.  He has no new trauma, no untoward events.  He is taking his medicine.  HPI  Body mass index is 30.37 kg/(m^2).  ROS  Review of Systems  HENT: Negative for congestion.   Respiratory: Negative for cough and shortness of breath.   Cardiovascular: Negative for chest pain and leg swelling.  Endocrine: Negative for cold intolerance.  Musculoskeletal: Positive for back pain and arthralgias.  Allergic/Immunologic: Negative for environmental allergies.  All other systems reviewed and are negative.   Past Medical History  Diagnosis Date  . Broken wrist     History reviewed. No pertinent past surgical history.  History reviewed. No pertinent family history.  Social History Social History  Substance Use Topics  . Smoking status: Never Smoker   . Smokeless tobacco: Never Used  . Alcohol Use: No    No Known Allergies  Current Outpatient Prescriptions  Medication Sig Dispense Refill  . cetirizine (ZYRTEC) 10 MG tablet Take 10 mg by mouth daily as needed for allergies.    Marland Kitchen HYDROcodone-acetaminophen (NORCO/VICODIN) 5-325 MG tablet Take 1 tablet by mouth every 4 (four) hours as needed for moderate pain (Must last 30 days.  Do not take and drive a car or use machinery.). 120 tablet 0  . ibuprofen (ADVIL,MOTRIN) 800 MG tablet Take 1 tablet (800 mg total) by mouth 3 (three) times daily. 21 tablet 0  . naproxen (NAPROSYN) 500 MG tablet Take 500 mg by mouth 2 (two) times daily with a meal.     No current facility-administered medications for this visit.     Physical Exam  Blood pressure 116/73, pulse 79, temperature 97.9 F (36.6 C), height  (1.905 m), weight 243 lb (110.224 kg).  Constitutional: overall normal  hygiene, normal nutrition, well developed, normal grooming, normal body habitus. Assistive device:none  Musculoskeletal: gait and station Limp none, muscle tone and strength are normal, no tremors or atrophy is present.  .  Neurological: coordination overall normal.  Deep tendon reflex/nerve stretch intact.  Sensation normal.  Cranial nerves II-XII intact.   Skin:   normal overall no scars, lesions, ulcers or rashes. No psoriasis.  Psychiatric: Alert and oriented x 3.  Recent memory intact, remote memory unclear.  Normal mood and affect. Well groomed.  Good eye contact.  Cardiovascular: overall no swelling, no varicosities, no edema bilaterally, normal temperatures of the legs and arms, no clubbing, cyanosis and good capillary refill.  Lymphatic: palpation is normal.  Spine/Pelvis examination:  Inspection:  Overall, sacoiliac joint benign and hips nontender; without crepitus or defects.   Thoracic spine inspection: Alignment normal without kyphosis present   Lumbar spine inspection:  Alignment  with normal lumbar lordosis, without scoliosis apparent.   Thoracic spine palpation:  without tenderness of spinal processes   Lumbar spine palpation: without tenderness of lumbar area; without tightness of lumbar muscles    Range of Motion:   Lumbar flexion, forward flexion is full  without pain or tenderness    Lumbar extension is full  without pain or tenderness   Left lateral bend is Normal  without pain or tenderness   Right lateral bend is Normal without pain or tenderness   Straight leg raising is Normal  Strength & tone: Normal   Stability overall normal stability     The patient has been educated about the nature of the problem(s) and counseled on treatment options.  The patient appeared to understand what I have discussed and is in agreement with it.  Encounter Diagnosis  Name Primary?  . Midline thoracic back pain Yes    PLAN Call if any problems.  Precautions  discussed.  Continue current medications.   Return to clinic 3 months   Electronically Signed Isaac McleanWayne Ayan Heffington, MD 7/19/20179:17 AM

## 2016-08-26 ENCOUNTER — Telehealth: Payer: Self-pay | Admitting: Orthopaedic Surgery

## 2016-08-26 NOTE — Telephone Encounter (Signed)
Patient called and requested a refill on Hydrocodone-Acetaminophen 5-325 mgs  Qty  120  Sig: Take 1 tablet by mouth every 4 (four) hours as needed for moderate pain (Must last 30 days.  Do not take and drive a car or use machinery.). °

## 2016-08-27 MED ORDER — HYDROCODONE-ACETAMINOPHEN 5-325 MG PO TABS: 1.0000 | ORAL_TABLET | Freq: Four times a day (QID) | ORAL | 0 refills | Status: DC | PRN

## 2016-09-30 ENCOUNTER — Telehealth: Payer: Self-pay | Admitting: Orthopaedic Surgery

## 2016-09-30 NOTE — Telephone Encounter (Signed)
Routing to Dr. Harrison to advise 

## 2016-09-30 NOTE — Telephone Encounter (Signed)
Hydrocodone-Acetaminophen  5/325mg  Qty 110 Tablets  Take 1 tablet by mouth every 6 (six) hours as needed for moderate   pain (Must last 30 days. Do not take and drive a car or use   machinery.).

## 2016-09-30 NOTE — Telephone Encounter (Signed)
yes

## 2016-10-05 ENCOUNTER — Other Ambulatory Visit: Payer: Self-pay | Admitting: *Deleted

## 2016-10-05 MED ORDER — HYDROCODONE-ACETAMINOPHEN 5-325 MG PO TABS: 1.0000 | ORAL_TABLET | Freq: Four times a day (QID) | ORAL | 0 refills | Status: DC | PRN

## 2016-10-14 ENCOUNTER — Encounter: Payer: Self-pay | Admitting: Orthopaedic Surgery

## 2016-10-14 ENCOUNTER — Ambulatory Visit (INDEPENDENT_AMBULATORY_CARE_PROVIDER_SITE_OTHER): Payer: Managed Care, Other (non HMO) | Admitting: Orthopaedic Surgery

## 2016-10-14 VITALS — BP 127/75 | HR 83 | Temp 98.1°F | Ht 75.0 in | Wt 258.0 lb

## 2016-10-14 DIAGNOSIS — G8929 Other chronic pain: Secondary | ICD-10-CM

## 2016-10-14 DIAGNOSIS — M546 Pain in thoracic spine: Secondary | ICD-10-CM

## 2016-10-14 NOTE — Progress Notes (Signed)
Patient ZO:XWRUEAV LYAL Ortiz, male DOB:August 29, 1996, 20 y.o. [id number]  Chief Complaint  Patient presents with  . Follow-up    T Spine    HPI  Isaac Ortiz is a 20 y.o. male who has chronic mid thoracic and left scapular pain.  He has no new trauma.  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  He is doing his exercises and taking his medicine. HPI  Body mass index is 32.25 kg/m.  ROS  Review of Systems  HENT: Negative for congestion.   Respiratory: Negative for cough and shortness of breath.   Cardiovascular: Negative for chest pain and leg swelling.  Endocrine: Negative for cold intolerance.  Musculoskeletal: Positive for arthralgias and back pain.  Allergic/Immunologic: Negative for environmental allergies.  All other systems reviewed and are negative.   Past Medical History:  Diagnosis Date  . Broken wrist     History reviewed. No pertinent surgical history.  History reviewed. No pertinent family history.  Social History Social History  Substance Use Topics  . Smoking status: Never Smoker  . Smokeless tobacco: Never Used  . Alcohol use No    No Known Allergies  Current Outpatient Prescriptions  Medication Sig Dispense Refill  . cetirizine (ZYRTEC) 10 MG tablet Take 10 mg by mouth daily as needed for allergies.    Marland Kitchen HYDROcodone-acetaminophen (NORCO/VICODIN) 5-325 MG tablet Take 1 tablet by mouth every 6 (six) hours as needed for moderate pain (Must last 30 days.Do not take and drive a car or use machinery.). 110 tablet 0  . ibuprofen (ADVIL,MOTRIN) 800 MG tablet Take 1 tablet (800 mg total) by mouth 3 (three) times daily. 21 tablet 0  . naproxen (NAPROSYN) 500 MG tablet Take 500 mg by mouth 2 (two) times daily with a meal.     No current facility-administered medications for this visit.      Physical Exam  Blood pressure 127/75, pulse 83, temperature 98.1 F (36.7 C), height 6\' 3"  (1.905 m), weight 258 lb (117  kg).  Constitutional: overall normal hygiene, normal nutrition, well developed, normal grooming, normal body habitus. Assistive device:none  Musculoskeletal: gait and station Limp none, muscle tone and strength are normal, no tremors or atrophy is present.  .  Neurological: coordination overall normal.  Deep tendon reflex/nerve stretch intact.  Sensation normal.  Cranial nerves II-XII intact.   Skin:   Normal overall no scars, lesions, ulcers or rashes. No psoriasis.  Psychiatric: Alert and oriented x 3.  Recent memory intact, remote memory unclear.  Normal mood and affect. Well groomed.  Good eye contact.  Cardiovascular: overall no swelling, no varicosities, no edema bilaterally, normal temperatures of the legs and arms, no clubbing, cyanosis and good capillary refill.  Lymphatic: palpation is normal.  Spine/Pelvis examination:  Inspection:  Overall, sacoiliac joint benign and hips nontender; without crepitus or defects.   Thoracic spine inspection: Alignment normal without kyphosis present   Lumbar spine inspection:  Alignment  with normal lumbar lordosis, without scoliosis apparent.   Thoracic spine palpation:  with tenderness of spinal processes   Lumbar spine palpation: without tenderness of lumbar area; without tightness of lumbar muscles    Range of Motion:   Lumbar flexion, forward flexion is full without pain or tenderness    Lumbar extension is full without pain or tenderness   Left lateral bend is Normal  without pain or tenderness   Right lateral bend is Normal without pain or tenderness   Straight leg  raising is Normal   Strength & tone: Normal   Stability overall normal stability   He has some tenderness of the medial border of the left scapula with full ROM of the left shoulder.  The patient has been educated about the nature of the problem(s) and counseled on treatment options.  The patient appeared to understand what I have discussed and is in agreement with  it.  Encounter Diagnosis  Name Primary?  . Chronic midline thoracic back pain Yes    PLAN Call if any problems.  Precautions discussed.  Continue current medications.   Return to clinic 3 months   Electronically Signed Darreld McleanWayne Ethelreda Sukhu, MD 10/18/201710:17 AM

## 2016-11-02 ENCOUNTER — Telehealth: Payer: Self-pay | Admitting: Orthopedic Surgery

## 2016-11-03 MED ORDER — HYDROCODONE-ACETAMINOPHEN 5-325 MG PO TABS: 1.0000 | ORAL_TABLET | Freq: Four times a day (QID) | ORAL | 0 refills | Status: DC | PRN

## 2016-11-30 ENCOUNTER — Telehealth: Payer: Self-pay | Admitting: Orthopaedic Surgery

## 2016-12-01 MED ORDER — HYDROCODONE-ACETAMINOPHEN 5-325 MG PO TABS: 1.0000 | ORAL_TABLET | Freq: Four times a day (QID) | ORAL | 0 refills | Status: DC | PRN

## 2016-12-27 ENCOUNTER — Telehealth: Payer: Self-pay | Admitting: Orthopaedic Surgery

## 2016-12-29 MED ORDER — HYDROCODONE-ACETAMINOPHEN 5-325 MG PO TABS: 1.0000 | ORAL_TABLET | Freq: Four times a day (QID) | ORAL | 0 refills | Status: DC | PRN

## 2017-01-13 ENCOUNTER — Ambulatory Visit: Payer: Managed Care, Other (non HMO) | Admitting: Orthopaedic Surgery

## 2017-01-14 ENCOUNTER — Emergency Department (HOSPITAL_COMMUNITY)
Admission: EM | Admit: 2017-01-14 | Discharge: 2017-01-14 | Disposition: A | Discharge status: Home / Self Care | Payer: Managed Care, Other (non HMO) | Attending: Emergency Medicine | Admitting: Emergency Medicine

## 2017-01-14 ENCOUNTER — Encounter (HOSPITAL_COMMUNITY): Payer: Self-pay | Admitting: Emergency Medicine

## 2017-01-14 DIAGNOSIS — Y999 Unspecified external cause status: Secondary | ICD-10-CM | POA: Diagnosis not present

## 2017-01-14 DIAGNOSIS — S0990XA Unspecified injury of head, initial encounter: Secondary | ICD-10-CM | POA: Insufficient documentation

## 2017-01-14 DIAGNOSIS — Y92410 Unspecified street and highway as the place of occurrence of the external cause: Secondary | ICD-10-CM | POA: Diagnosis not present

## 2017-01-14 DIAGNOSIS — Y9389 Activity, other specified: Secondary | ICD-10-CM | POA: Insufficient documentation
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
## 2017-01-14 NOTE — Discharge Instructions (Signed)
Use Tylenol or Motrin as needed for pain.  Return here if your symptoms worsen.

## 2017-01-14 NOTE — ED Notes (Signed)
Patient given discharge instruction, verbalized understand. Patient ambulatory out of the department.  

## 2017-01-20 ENCOUNTER — Ambulatory Visit: Payer: Managed Care, Other (non HMO) | Admitting: Orthopaedic Surgery

## 2017-01-26 ENCOUNTER — Ambulatory Visit (INDEPENDENT_AMBULATORY_CARE_PROVIDER_SITE_OTHER): Payer: Managed Care, Other (non HMO) | Admitting: Orthopaedic Surgery

## 2017-01-26 VITALS — BP 136/74 | HR 91 | Temp 98.1°F | Ht 75.0 in | Wt 272.0 lb

## 2017-01-26 DIAGNOSIS — M546 Pain in thoracic spine: Secondary | ICD-10-CM

## 2017-01-26 DIAGNOSIS — G8929 Other chronic pain: Secondary | ICD-10-CM | POA: Diagnosis not present

## 2017-01-26 MED ORDER — HYDROCODONE-ACETAMINOPHEN 5-325 MG PO TABS: 1.0000 | ORAL_TABLET | Freq: Four times a day (QID) | ORAL | 0 refills | Status: DC | PRN
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
## 2017-02-24 ENCOUNTER — Ambulatory Visit: Payer: Managed Care, Other (non HMO) | Admitting: Orthopaedic Surgery

## 2017-02-25 ENCOUNTER — Ambulatory Visit (INDEPENDENT_AMBULATORY_CARE_PROVIDER_SITE_OTHER): Payer: Managed Care, Other (non HMO) | Admitting: Orthopaedic Surgery

## 2017-02-25 ENCOUNTER — Emergency Department (HOSPITAL_COMMUNITY)
Admission: EM | Admit: 2017-02-25 | Discharge: 2017-02-25 | Disposition: A | Discharge status: Home / Self Care | Payer: Managed Care, Other (non HMO) | Attending: Emergency Medicine | Admitting: Emergency Medicine

## 2017-02-25 ENCOUNTER — Encounter: Payer: Self-pay | Admitting: Orthopaedic Surgery

## 2017-02-25 ENCOUNTER — Encounter (HOSPITAL_COMMUNITY): Payer: Self-pay

## 2017-02-25 VITALS — BP 106/67 | HR 84 | Temp 97.9°F | Resp 18 | Ht 77.0 in | Wt 278.0 lb

## 2017-02-25 DIAGNOSIS — Z5321 Procedure and treatment not carried out due to patient leaving prior to being seen by health care provider: Secondary | ICD-10-CM | POA: Insufficient documentation

## 2017-02-25 DIAGNOSIS — S0993XA Unspecified injury of face, initial encounter: Secondary | ICD-10-CM | POA: Insufficient documentation

## 2017-02-25 DIAGNOSIS — Y999 Unspecified external cause status: Secondary | ICD-10-CM | POA: Insufficient documentation

## 2017-02-25 DIAGNOSIS — Y939 Activity, unspecified: Secondary | ICD-10-CM | POA: Diagnosis not present

## 2017-02-25 DIAGNOSIS — Y929 Unspecified place or not applicable: Secondary | ICD-10-CM | POA: Insufficient documentation

## 2017-02-25 DIAGNOSIS — M546 Pain in thoracic spine: Secondary | ICD-10-CM | POA: Diagnosis not present

## 2017-02-25 DIAGNOSIS — G8929 Other chronic pain: Secondary | ICD-10-CM

## 2017-02-25 DIAGNOSIS — W500XXA Accidental hit or strike by another person, initial encounter: Secondary | ICD-10-CM | POA: Diagnosis not present

## 2017-02-25 MED ORDER — HYDROCODONE-ACETAMINOPHEN 5-325 MG PO TABS: 1.0000 | ORAL_TABLET | Freq: Four times a day (QID) | ORAL | 0 refills | Status: DC | PRN

## 2017-02-25 NOTE — ED Notes (Addendum)
Pt states he has to leave and not willing to wait for exam. PA aware. Pt states he will be seen in home town.
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
## 2017-02-25 NOTE — ED Provider Notes (Signed)
Notified by nurse that patient eloped prior to being seen.   Isaac Ortiz, [place] 02/25/17 2334    Isaac MawKristen N Ward, DO 02/25/17 2339

## 2017-02-26 ENCOUNTER — Emergency Department (HOSPITAL_COMMUNITY): Payer: Managed Care, Other (non HMO)

## 2017-02-26 ENCOUNTER — Encounter (HOSPITAL_COMMUNITY): Payer: Self-pay

## 2017-02-26 ENCOUNTER — Emergency Department (HOSPITAL_COMMUNITY)
Admission: EM | Admit: 2017-02-26 | Discharge: 2017-02-26 | Disposition: A | Discharge status: Home / Self Care | Payer: Managed Care, Other (non HMO) | Attending: Emergency Medicine | Admitting: Emergency Medicine

## 2017-02-26 DIAGNOSIS — Y92002 Bathroom of unspecified non-institutional (private) residence single-family (private) house as the place of occurrence of the external cause: Secondary | ICD-10-CM | POA: Diagnosis not present

## 2017-02-26 DIAGNOSIS — Z79899 Other long term (current) drug therapy: Secondary | ICD-10-CM | POA: Insufficient documentation

## 2017-02-26 DIAGNOSIS — Y999 Unspecified external cause status: Secondary | ICD-10-CM | POA: Diagnosis not present

## 2017-02-26 DIAGNOSIS — Y9301 Activity, walking, marching and hiking: Secondary | ICD-10-CM | POA: Diagnosis not present

## 2017-02-26 DIAGNOSIS — S022XXA Fracture of nasal bones, initial encounter for closed fracture: Secondary | ICD-10-CM | POA: Diagnosis not present

## 2017-02-26 DIAGNOSIS — W01198A Fall on same level from slipping, tripping and stumbling with subsequent striking against other object, initial encounter: Secondary | ICD-10-CM | POA: Diagnosis not present

## 2017-02-26 DIAGNOSIS — S0990XA Unspecified injury of head, initial encounter: Secondary | ICD-10-CM | POA: Diagnosis present

## 2017-02-26 MED ORDER — ACETAMINOPHEN 500 MG PO TABS
1000.0000 mg | ORAL_TABLET | Freq: Once | ORAL | Status: AC
  Administered 2017-02-26: 1000 mg via ORAL
  Filled 2017-02-26: qty 2

## 2017-02-26 NOTE — ED Triage Notes (Signed)
Pt states he was walking in the dark, states he tripped on a hamper and hit his nose on the bathroom counter, states his nose bled for a few minutes, did not have loc, states he feels dizzy now.

## 2017-02-26 NOTE — ED Provider Notes (Signed)
[facility]-EMER[facility]ENCY DEPT Provider Note   CSN: [id number] Arrival date & time: 02/26/17  0018  Time seen 12:50 AM   History   Chief Complaint Chief Complaint  Patient presents with  . Fall    facial injury    HPI Isaac Ortiz is a 21 y.o. male.  HPI  patient states he was at his grandmother's house and he tripped and fell hitting his nose on the countertop in the bathroom. He states it happened around 11 PM. He denies loss of consciousness. He states he had bleeding from his nose on both sides but that has resolved. He can breathe through his nose. He had nausea initially but that resolved without vomiting. He had blurred vision initially but that has resolved. He denies any numbness or tingling of her extremities or any neck pain.  PCP none Ortho Dr Hilda Lias  Past Medical History:  Diagnosis Date  . Broken wrist     There are no active problems to display for this patient.   History reviewed. No pertinent surgical history.     Home Medications    Prior to Admission medications   Medication Sig Start Date End Date Taking? Authorizing Provider  cetirizine (ZYRTEC) 10 MG tablet Take 10 mg by mouth daily as needed for allergies.    Historical Provider, MD  HYDROcodone-acetaminophen (NORCO/VICODIN) 5-325 MG tablet Take 1 tablet by mouth every 6 (six) hours as needed for moderate pain (Must last 30 days.Do not take and drive a car or use machinery.). 02/25/17   Darreld Mclean, MD  ibuprofen (ADVIL,MOTRIN) 800 MG tablet Take 1 tablet (800 mg total) by mouth 3 (three) times daily. 04/16/16   Elson Areas, PA-C  naproxen (NAPROSYN) 500 MG tablet Take 500 mg by mouth 2 (two) times daily with a meal.    Historical Provider, MD    Family History No family history on file.  Social History Social History  Substance Use Topics  . Smoking status: Never Smoker  . Smokeless tobacco: Never Used  . Alcohol use Yes     Comment: occ  employed Drank "a small amount"  tonight   Allergies   Patient has no known allergies.   Review of Systems Review of Systems  All other systems reviewed and are negative.    Physical Exam Updated Vital Signs BP (!) 109/52   Pulse 89   Temp 98.1 F (36.7 C) (Oral)   Resp 16   Ht 6\' 3"  (1.905 m)   Wt 270 lb (122.5 kg)   SpO2 100%   BMI 33.75 kg/m   Vital signs normal    Physical Exam  Constitutional: He is oriented to person, place, and time. He appears well-developed and well-nourished. No distress.  HENT:  Head: Normocephalic.  Right Ear: External ear normal.  Left Ear: External ear normal.  Mouth/Throat: Oropharynx is clear and moist.  Patient has some faint bruising of his left lower eyelids with mild tenderness to palpation in the left cheek area. He is nontender to palpation over the frontal bones or on the right side. He states when he opens and closes his mouth he has some minor discomfort in his left TMJ. When I inspect his nares there is no active bleeding. There is no hematoma seen on the septum. He is tender to palpation over the bridge of the nose.  Eyes: Conjunctivae and EOM are normal. Pupils are equal, round, and reactive to light.  Neck: Normal range of motion. Neck supple.  Cervical spine  is nontender  Cardiovascular: Normal rate.   Pulmonary/Chest: Effort normal. No respiratory distress.  Musculoskeletal: Normal range of motion.  Neurological: He is alert and oriented to person, place, and time. No cranial nerve deficit.  Skin: Skin is warm and dry. There is pallor.  Nursing note and vitals reviewed.    ED Treatments / Results    Radiology Ct Head Wo Contrast   Ct Maxillofacial Wo Cm  Result Date: 02/26/2017 CLINICAL DATA:  Tripped on hamper and struck nose on bathroom cabinet. Epistaxis. No loss of consciousness. Now dizzy. EXAM: CT HEAD WITHOUT CONTRAST CT MAXILLOFACIAL WITHOUT CONTRAST TECHNIQUE: Multidetector CT imaging of the head and maxillofacial structures were  performed using the standard protocol without intravenous contrast. Multiplanar CT image reconstructions of the maxillofacial structures were also generated. COMPARISON:  None. FINDINGS: CT HEAD FINDINGS BRAIN: The ventricles and sulci are normal. No intraparenchymal hemorrhage, mass effect nor midline shift. No acute large vascular territory infarcts. No abnormal extra-axial fluid collections. Basal cisterns are patent. VASCULAR: Unremarkable. SKULL/SOFT TISSUES: No skull fracture. No significant soft tissue swelling. OTHER: None. CT MAXILLOFACIAL FINDINGS OSSEOUS: The mandible is intact, the condyles are located. Acute mildly comminuted and depressed distal LEFT nasal bone fracture. No ex involvement of the nasal process of the maxilla or nasal septum/spine. ORBITS: Ocular globes and orbital contents are normal. SINUSES: Mild mucosal thickening. Scattered subcentimeter mucosal retention cyst. Nasal septum is deviated the RIGHT. Included mastoid aircells are well aerated. SOFT TISSUES: Mild nasal soft tissue swelling. No subcutaneous gas or radiopaque foreign bodies. IMPRESSION: CT HEAD: Normal. CT MAXILLOFACIAL: Acute LEFT nasal bone fracture. Electronically Signed   By: Awilda Metroourtnay  Bloomer M.D.   On: 02/26/2017 01:34    Procedures Procedures (including critical care time)  Medications Ordered in ED Medications  acetaminophen (TYLENOL) tablet 1,000 mg (1,000 mg Oral Given 02/26/17 0110)     Initial Impression / Assessment and Plan / ED Course  I have reviewed the triage vital signs and the nursing notes.  Pertinent labs & imaging results that were available during my care of the patient were reviewed by me and considered in my medical decision making (see chart for details).  Pt was given tylenol for pain and an ice pack. CT of the head and maxillofacial bones was ordered.   Pt was given his CT results and need to f/u with Dr Suszanne Connerseoh, ENT.  Also given head injury precautions.   Review of the [place] database shows patient gets narcotics on a regular basis from his orthopedist. He gets hydrocodone 5/325. In October he got #110 pills, November the 90 pills, December 80 pills, January 50 pills and 75 pills, last filled January 30.  Final Clinical Impressions(s) / ED Diagnoses   Final diagnoses:  Closed fracture of nasal bone, initial encounter    New Prescriptions OTC motrin  Plan discharge  Devoria AlbeIva Chibuike Fleek, MD, Concha PyoFACEP    Graciano Batson, MD 02/26/17 [phone]

## 2017-02-26 NOTE — Discharge Instructions (Signed)
Use ice packs for comfort. You can take ibuprofen 600 mg 4 times a day with your hydrocodone for pain. You should call the ENT (Ears, Nose and Throat Specialist) physician, Dr Suszanne Connerseoh, to have him recheck your nose next week. [place] and [place] one day a week. [place].   Return to the ED for any problems listed on the head injury sheet.

## 2017-02-26 NOTE — ED Triage Notes (Signed)
Pt initially stated the fall happened 30 minutes pta, now when [facility], he states he didn't want to wait there because they were taking too long and came here instead.

## 2017-03-01 ENCOUNTER — Ambulatory Visit (INDEPENDENT_AMBULATORY_CARE_PROVIDER_SITE_OTHER): Payer: Managed Care, Other (non HMO) | Admitting: Otolaryngology

## 2017-03-01 DIAGNOSIS — S022XXA Fracture of nasal bones, initial encounter for closed fracture: Secondary | ICD-10-CM | POA: Diagnosis not present

## 2017-03-27 ENCOUNTER — Telehealth: Payer: Self-pay | Admitting: Orthopaedic Surgery

## 2017-03-30 MED ORDER — HYDROCODONE-ACETAMINOPHEN 5-325 MG PO TABS: 1.0000 | ORAL_TABLET | Freq: Four times a day (QID) | ORAL | 0 refills | Status: DC | PRN

## 2017-04-27 ENCOUNTER — Other Ambulatory Visit: Payer: Self-pay | Admitting: *Deleted

## 2017-04-27 ENCOUNTER — Other Ambulatory Visit: Payer: Self-pay | Admitting: Orthopaedic Surgery

## 2017-04-27 MED ORDER — HYDROCODONE-ACETAMINOPHEN 5-325 MG PO TABS: 1.0000 | ORAL_TABLET | Freq: Four times a day (QID) | ORAL | 0 refills | Status: DC | PRN

## 2017-05-26 ENCOUNTER — Telehealth: Payer: Self-pay | Admitting: Orthopedic Surgery

## 2017-05-26 MED ORDER — HYDROCODONE-ACETAMINOPHEN 5-325 MG PO TABS: 1.0000 | ORAL_TABLET | Freq: Four times a day (QID) | ORAL | 0 refills | Status: DC | PRN

## 2017-07-07 ENCOUNTER — Encounter: Payer: Self-pay | Admitting: Orthopaedic Surgery

## 2017-07-07 ENCOUNTER — Ambulatory Visit (INDEPENDENT_AMBULATORY_CARE_PROVIDER_SITE_OTHER): Payer: Managed Care, Other (non HMO) | Admitting: Orthopaedic Surgery

## 2017-07-07 VITALS — BP 132/86 | HR 78 | Temp 97.7°F | Ht 76.0 in | Wt 294.0 lb

## 2017-07-07 DIAGNOSIS — M546 Pain in thoracic spine: Secondary | ICD-10-CM | POA: Diagnosis not present

## 2017-07-07 DIAGNOSIS — G8929 Other chronic pain: Secondary | ICD-10-CM | POA: Diagnosis not present

## 2017-07-07 MED ORDER — HYDROCODONE-ACETAMINOPHEN 5-325 MG PO TABS: 1.0000 | ORAL_TABLET | Freq: Four times a day (QID) | ORAL | 0 refills | Status: DC | PRN

## 2017-07-07 NOTE — Progress Notes (Signed)
Patient Isaac Ortiz, male DOB:May 05, 1996, 21 y.o. NWG:[id number]  Chief Complaint  Patient presents with  . Back Pain    low back pain    HPI  Isaac Ortiz is a 21 y.o. male who has chronic lower back pain.  He has no new trauma, no paresthesias, no weakness.  He is active.  He has gained 50 pounds since last July.  I have talked to him about this.  He needs to try to lose weight.  He agrees. HPI  Body mass index is 35.79 kg/m.  ROS  Review of Systems  HENT: Negative for congestion.   Respiratory: Negative for cough and shortness of breath.   Cardiovascular: Negative for chest pain and leg swelling.  Endocrine: Negative for cold intolerance.  Musculoskeletal: Positive for arthralgias and back pain.  Allergic/Immunologic: Negative for environmental allergies.  All other systems reviewed and are negative.   Past Medical History:  Diagnosis Date  . Broken wrist     No past surgical history on file.  No family history on file.  Social History Social History  Substance Use Topics  . Smoking status: Never Smoker  . Smokeless tobacco: Never Used  . Alcohol use Yes     Comment: occ    No Known Allergies  Current Outpatient Prescriptions  Medication Sig Dispense Refill  . cetirizine (ZYRTEC) 10 MG tablet Take 10 mg by mouth daily as needed for allergies.    Marland Kitchen HYDROcodone-acetaminophen (NORCO/VICODIN) 5-325 MG tablet Take 1 tablet by mouth every 6 (six) hours as needed for moderate pain (Must last 30 days.Do not take and drive a car or use machinery.). 60 tablet 0  . ibuprofen (ADVIL,MOTRIN) 800 MG tablet Take 1 tablet (800 mg total) by mouth 3 (three) times daily. 21 tablet 0  . naproxen (NAPROSYN) 500 MG tablet Take 500 mg by mouth 2 (two) times daily with a meal.     No current facility-administered medications for this visit.      Physical Exam  Blood pressure 132/86, pulse 78, temperature 97.7 F (36.5 C), height 6\' 4"  (1.93 m), weight 294 lb (133.4  kg).  Constitutional: overall normal hygiene, normal nutrition, well developed, normal grooming, normal body habitus. Assistive device:none  Musculoskeletal: gait and station Limp none, muscle tone and strength are normal, no tremors or atrophy is present.  .  Neurological: coordination overall normal.  Deep tendon reflex/nerve stretch intact.  Sensation normal.  Cranial nerves II-XII intact.   Skin:   Normal overall no scars, lesions, ulcers or rashes. No psoriasis.  Psychiatric: Alert and oriented x 3.  Recent memory intact, remote memory unclear.  Normal mood and affect. Well groomed.  Good eye contact.  Cardiovascular: overall no swelling, no varicosities, no edema bilaterally, normal temperatures of the legs and arms, no clubbing, cyanosis and good capillary refill.  Lymphatic: palpation is normal.  Spine/Pelvis examination:  Inspection:  Overall, sacoiliac joint benign and hips nontender; without crepitus or defects.   Thoracic spine inspection: Alignment normal without kyphosis present   Lumbar spine inspection:  Alignment  with normal lumbar lordosis, without scoliosis apparent.   Thoracic spine palpation:  without tenderness of spinal processes   Lumbar spine palpation: with tenderness of lumbar area; without tightness of lumbar muscles    Range of Motion:   Lumbar flexion, forward flexion is 45 without pain or tenderness    Lumbar extension is full without pain or tenderness   Left lateral bend is Normal  without pain or tenderness  Right lateral bend is Normal without pain or tenderness   Straight leg raising is Normal   Strength & tone: Normal   Stability overall normal stability     The patient has been educated about the nature of the problem(s) and counseled on treatment options.  The patient appeared to understand what I have discussed and is in agreement with it.  Encounter Diagnosis  Name Primary?  . Chronic midline thoracic back pain Yes      PLAN Call if any problems.  Precautions discussed.  Continue current medications.   Return to clinic 3 months   I have reviewed the [facility] Controlled Substance Reporting System web site prior to prescribing narcotic medicine for this patient.  Electronically Signed Darreld McleanWayne Cooper Stamp, MD 7/11/20188:42 AM

## 2017-07-27 IMAGING — CT CT HEAD W/O CM
3 of 6 series · 15 of 47 positions shown, 18 images · non-contrast
Comparison: None.

CLINICAL DATA: Tripped on hamper and struck nose on bathroom
cabinet. Epistaxis. No loss of consciousness. Now dizzy.

EXAM:
CT HEAD WITHOUT CONTRAST
CT MAXILLOFACIAL WITHOUT CONTRAST
TECHNIQUE: Multidetector CT imaging of the head and maxillofacial structures
were performed using the standard protocol without intravenous
contrast. Multiplanar CT image reconstructions of the maxillofacial
structures were also generated.

[Series 4: coronal soft tissue · coronal · 0.35mm/px · 3 of 76 slices shown]
[im 19/76  brain]
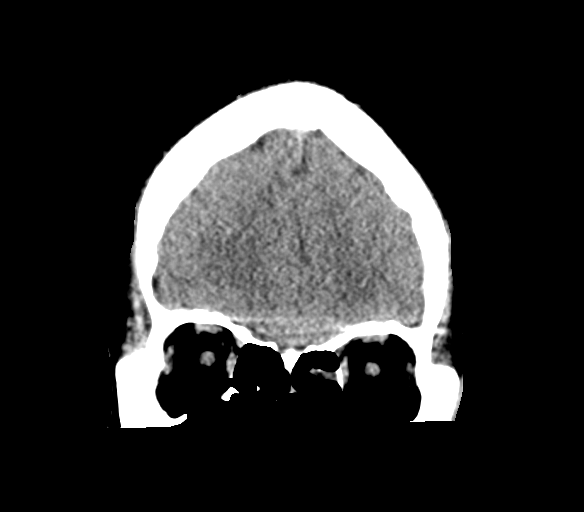
[im 38/76  brain]
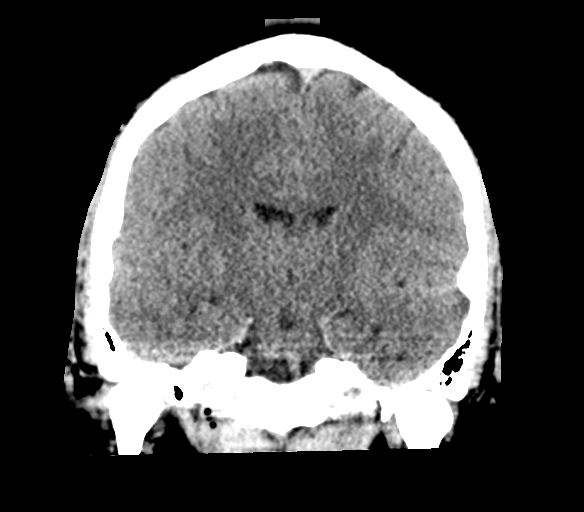
[im 57/76  brain]
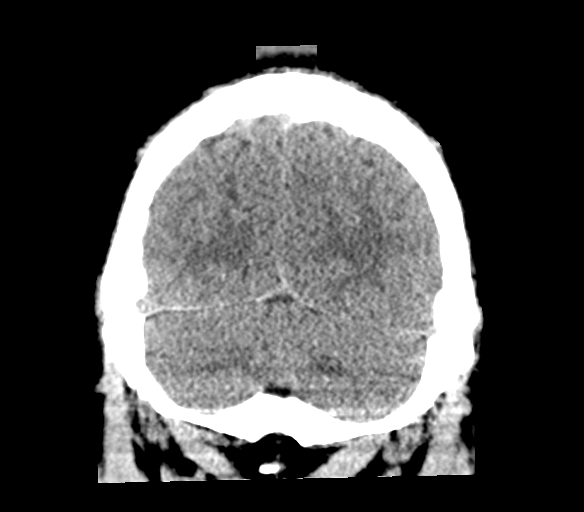

[Series 6: max soft · axial · 0.41mm/px · z∈[+1126,+1278]mm · 10 of 90 slices shown, 13 images]
[im 9/90  brain]
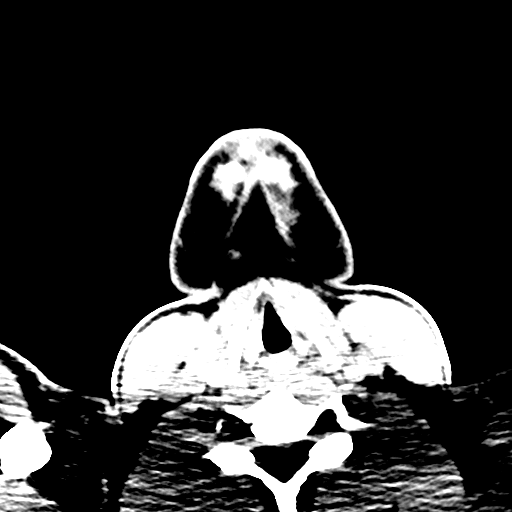
[im 9/90  bone]
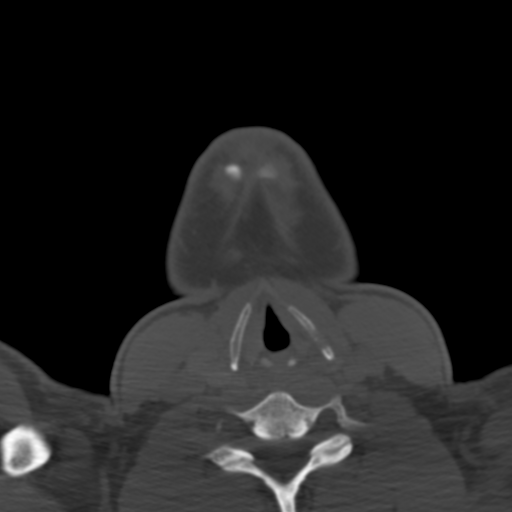
[im 17/90  brain]
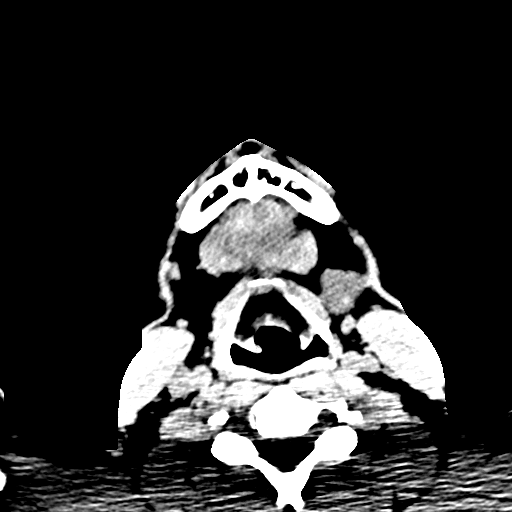
[im 26/90  brain]
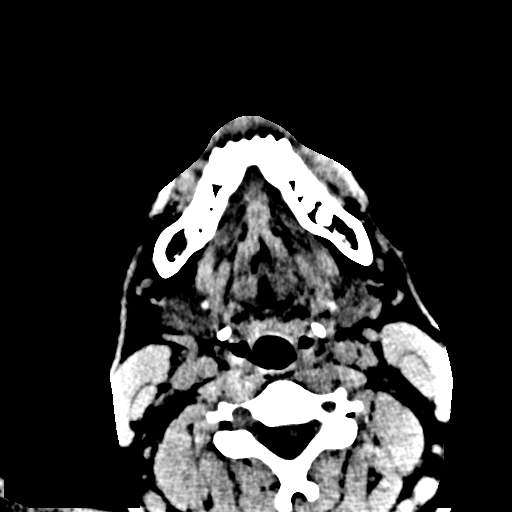
[im 34/90  brain]
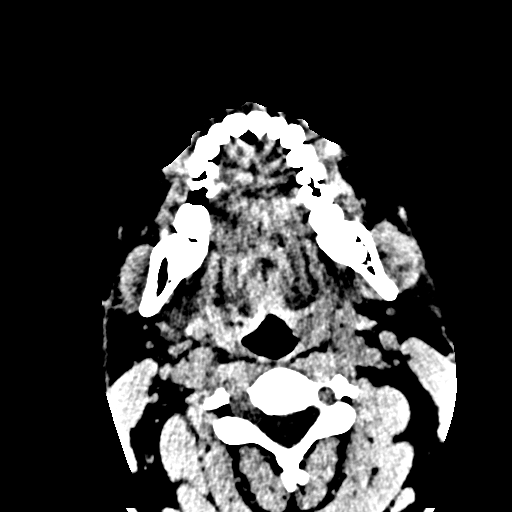
[im 43/90  brain]
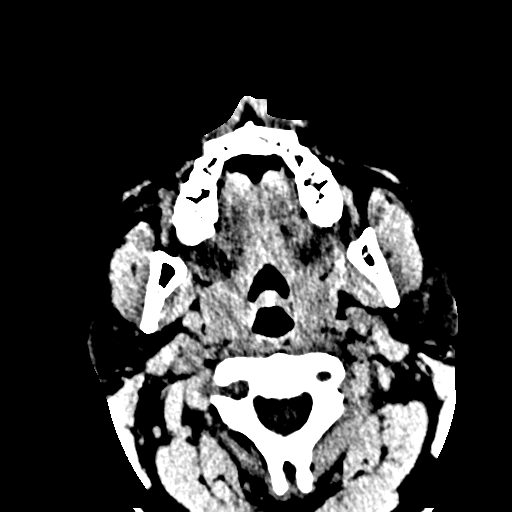
[im 43/90  bone]
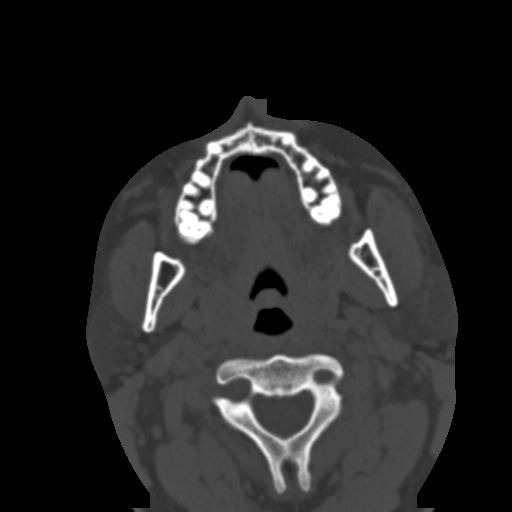
[im 51/90  brain]
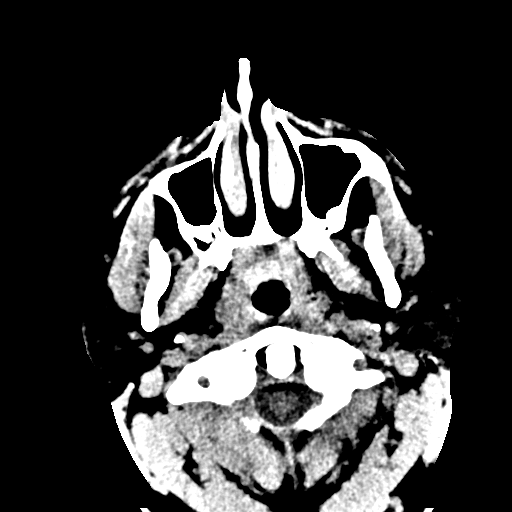
[im 60/90  brain]
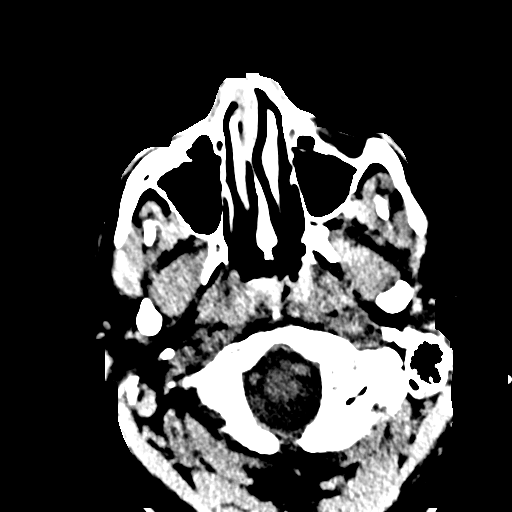
[im 68/90  brain]
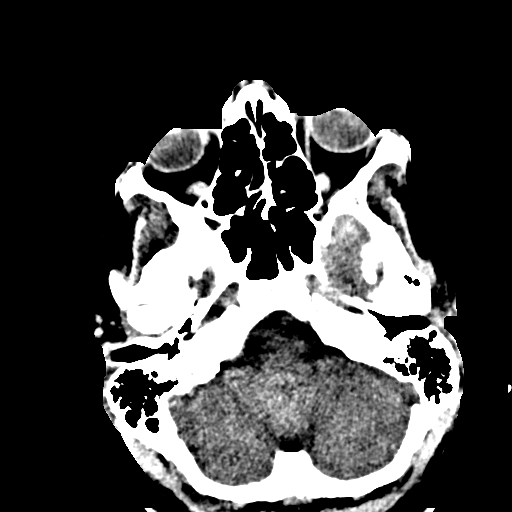
[im 77/90  brain]
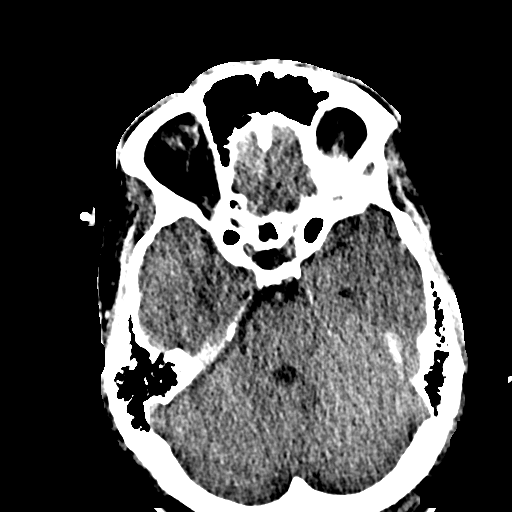
[im 77/90  bone]
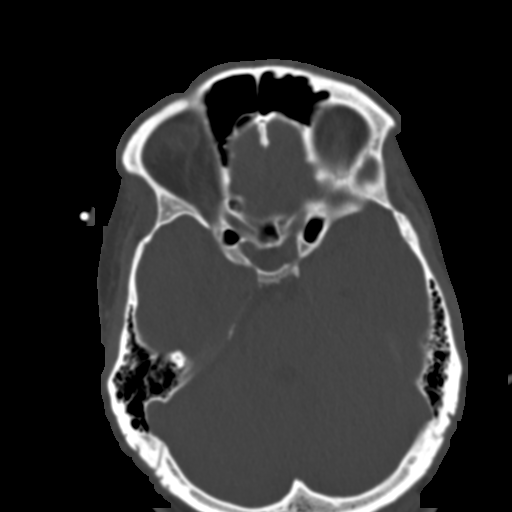
[im 85/90  brain]
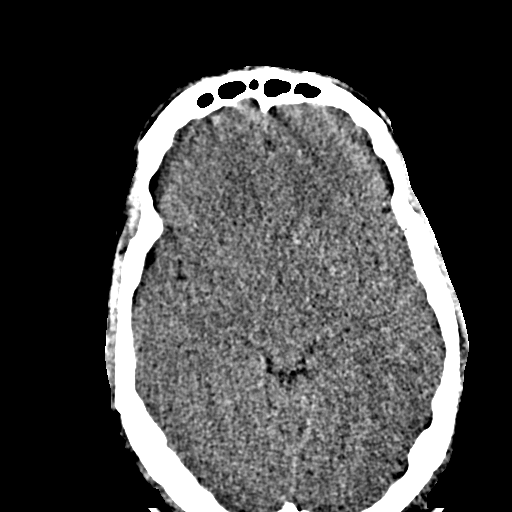

[Series 11: sagittal soft · sagittal · 0.38mm/px · 2 of 88 slices shown]
[im 30/88  brain]
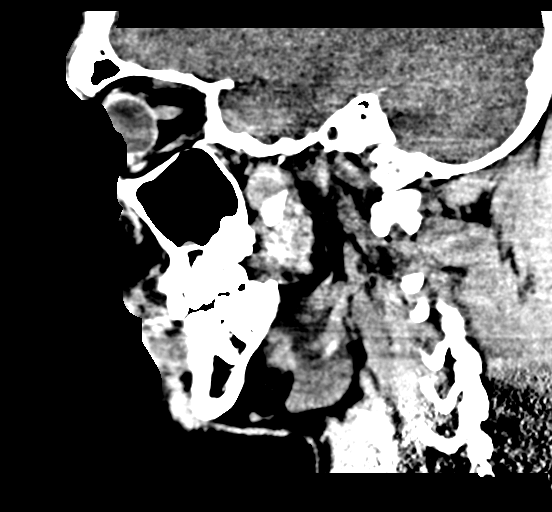
[im 59/88  brain]
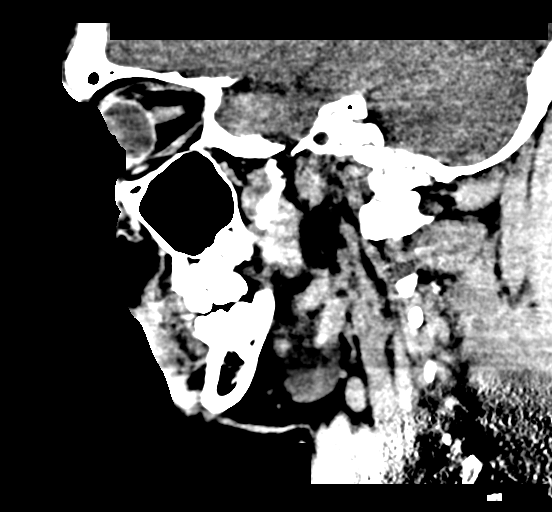

[15 of 47 positions shown; findings below may reference images not displayed]

FINDINGS: CT HEAD FINDINGS

BRAIN: The ventricles and sulci are normal. No intraparenchymal
hemorrhage, mass effect nor midline shift. No acute large vascular
territory infarcts. No abnormal extra-axial fluid collections. Basal
cisterns are patent.

VASCULAR: Unremarkable.

SKULL/SOFT TISSUES: No skull fracture. No significant soft tissue
swelling.

OTHER: None.

CT MAXILLOFACIAL FINDINGS

OSSEOUS: The mandible is intact, the condyles are located. Acute
mildly comminuted and depressed distal LEFT nasal bone fracture. No
ex involvement of the nasal process of the maxilla or nasal
septum/spine.

ORBITS: Ocular globes and orbital contents are normal.

SINUSES: Mild mucosal thickening. Scattered subcentimeter mucosal
retention cyst. Nasal septum is deviated the RIGHT. Included mastoid
aircells are well aerated.

SOFT TISSUES: Mild nasal soft tissue swelling. No subcutaneous gas
or radiopaque foreign bodies.
IMPRESSION: CT HEAD: Normal.

CT MAXILLOFACIAL: Acute LEFT nasal bone fracture.

## 2017-08-04 ENCOUNTER — Telehealth: Payer: Self-pay | Admitting: Orthopaedic Surgery

## 2017-08-04 MED ORDER — HYDROCODONE-ACETAMINOPHEN 5-325 MG PO TABS: 1.0000 | ORAL_TABLET | Freq: Four times a day (QID) | ORAL | 0 refills | Status: DC | PRN

## 2017-09-09 ENCOUNTER — Telehealth: Payer: Self-pay | Admitting: Orthopaedic Surgery

## 2017-09-09 MED ORDER — HYDROCODONE-ACETAMINOPHEN 5-325 MG PO TABS: 1.0000 | ORAL_TABLET | Freq: Four times a day (QID) | ORAL | 0 refills | Status: DC | PRN

## 2017-10-06 ENCOUNTER — Ambulatory Visit: Payer: Managed Care, Other (non HMO) | Admitting: Orthopaedic Surgery

## 2017-10-07 ENCOUNTER — Encounter: Payer: Self-pay | Admitting: Orthopaedic Surgery

## 2017-10-07 ENCOUNTER — Ambulatory Visit (INDEPENDENT_AMBULATORY_CARE_PROVIDER_SITE_OTHER): Payer: Self-pay | Admitting: Orthopaedic Surgery

## 2017-10-07 VITALS — BP 127/80 | HR 95 | Temp 98.0°F | Ht 76.0 in | Wt 297.0 lb

## 2017-10-07 DIAGNOSIS — G8929 Other chronic pain: Secondary | ICD-10-CM

## 2017-10-07 DIAGNOSIS — M546 Pain in thoracic spine: Secondary | ICD-10-CM

## 2017-10-07 MED ORDER — HYDROCODONE-ACETAMINOPHEN 5-325 MG PO TABS: 1.0000 | ORAL_TABLET | Freq: Four times a day (QID) | ORAL | 0 refills | Status: DC | PRN
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
## 2017-11-08 ENCOUNTER — Other Ambulatory Visit: Payer: Self-pay | Admitting: Orthopaedic Surgery

## 2017-11-10 ENCOUNTER — Telehealth: Payer: Self-pay | Admitting: Orthopaedic Surgery

## 2017-11-10 MED ORDER — HYDROCODONE-ACETAMINOPHEN 5-325 MG PO TABS
1.0000 | ORAL_TABLET | Freq: Four times a day (QID) | ORAL | 0 refills | Status: DC | PRN
Start: 2017-11-10 — End: 2017-12-13

## 2017-11-10 NOTE — Telephone Encounter (Signed)
Routing to DR. Keeling 

## 2017-12-13 ENCOUNTER — Other Ambulatory Visit: Payer: Self-pay | Admitting: Orthopaedic Surgery

## 2017-12-14 MED ORDER — HYDROCODONE-ACETAMINOPHEN 5-325 MG PO TABS: 1.0000 | ORAL_TABLET | Freq: Four times a day (QID) | ORAL | 0 refills | Status: DC | PRN

## 2018-01-06 ENCOUNTER — Encounter: Payer: Self-pay | Admitting: Orthopaedic Surgery

## 2018-01-06 ENCOUNTER — Ambulatory Visit: Payer: Self-pay | Admitting: Orthopaedic Surgery

## 2018-01-06 VITALS — BP 124/81 | HR 85 | Ht 76.0 in | Wt 293.0 lb

## 2018-01-06 DIAGNOSIS — G8929 Other chronic pain: Secondary | ICD-10-CM

## 2018-01-06 DIAGNOSIS — M546 Pain in thoracic spine: Secondary | ICD-10-CM

## 2018-01-06 NOTE — Progress Notes (Signed)
Patient ON:Isaac Ortiz, male DOB:[birth year], 22 y.o. XLK:[id number]  Chief Complaint  Patient presents with  . Back Pain    HPI  Isaac Ortiz is a 22 y.o. male who has chronic mid thoracic pain.  He has good and bad days.  Cold weather and increased activity make it worse.  He has no new trauma, no paresthesias. HPI  Body mass index is 35.67 kg/m.  ROS  Review of Systems  HENT: Negative for congestion.   Respiratory: Negative for cough and shortness of breath.   Cardiovascular: Negative for chest pain and leg swelling.  Endocrine: Negative for cold intolerance.  Musculoskeletal: Positive for arthralgias and back pain.  Allergic/Immunologic: Negative for environmental allergies.  All other systems reviewed and are negative.   Past Medical History:  Diagnosis Date  . Broken wrist     History reviewed. No pertinent surgical history.  History reviewed. No pertinent family history.  Social History Social History   Tobacco Use  . Smoking status: Never Smoker  . Smokeless tobacco: Never Used  Substance Use Topics  . Alcohol use: Yes    Comment: occ  . Drug use: No    No Known Allergies  Current Outpatient Medications  Medication Sig Dispense Refill  . HYDROcodone-acetaminophen (NORCO/VICODIN) 5-325 MG tablet Take 1 tablet by mouth every 6 (six) hours as needed for moderate pain (Must last 30 days.Do not take and drive a car or use machinery.). 50 tablet 0  . naproxen (NAPROSYN) 500 MG tablet Take 500 mg by mouth 2 (two) times daily with a meal.    . cetirizine (ZYRTEC) 10 MG tablet Take 10 mg by mouth daily as needed for allergies.     No current facility-administered medications for this visit.      Physical Exam  Blood pressure 124/81, pulse 85, height 6\' 4"  (1.93 m), weight 293 lb (132.9 kg).  Constitutional: overall normal hygiene, normal nutrition, well developed, normal grooming, normal body habitus. Assistive device:none  Musculoskeletal: gait  and station Limp none, muscle tone and strength are normal, no tremors or atrophy is present.  .  Neurological: coordination overall normal.  Deep tendon reflex/nerve stretch intact.  Sensation normal.  Cranial nerves II-XII intact.   Skin:   Normal overall no scars, lesions, ulcers or rashes. No psoriasis.  Psychiatric: Alert and oriented x 3.  Recent memory intact, remote memory unclear.  Normal mood and affect. Well groomed.  Good eye contact.  Cardiovascular: overall no swelling, no varicosities, no edema bilaterally, normal temperatures of the legs and arms, no clubbing, cyanosis and good capillary refill.  Lymphatic: palpation is normal.  Spine/Pelvis examination:  Inspection:  Overall, sacoiliac joint benign and hips nontender; without crepitus or defects.   Thoracic spine inspection: Alignment normal without kyphosis present   Lumbar spine inspection:  Alignment  with normal lumbar lordosis, without scoliosis apparent.   Thoracic spine palpation:  without tenderness of spinal processes   Lumbar spine palpation: without tenderness of lumbar area; without tightness of lumbar muscles    Range of Motion:   Lumbar flexion, forward flexion is normal without pain or tenderness    Lumbar extension is full without pain or tenderness   Left lateral bend is normal without pain or tenderness   Right lateral bend is normal without pain or tenderness   Straight leg raising is normal  Strength & tone: normal   Stability overall normal stability All other systems reviewed and are negative   The patient has been educated  about the nature of the problem(s) and counseled on treatment options.  The patient appeared to understand what I have discussed and is in agreement with it.  Encounter Diagnosis  Name Primary?  . Chronic midline thoracic back pain Yes    PLAN Call if any problems.  Precautions discussed.  Continue current medications.   Return to clinic 4  months   Electronically Signed Darreld McleanWayne Versa Craton, MD 1/10/201910:21 AM

## 2018-01-11 ENCOUNTER — Other Ambulatory Visit: Payer: Self-pay | Admitting: Orthopaedic Surgery

## 2018-01-11 MED ORDER — HYDROCODONE-ACETAMINOPHEN 5-325 MG PO TABS: 1.0000 | ORAL_TABLET | Freq: Four times a day (QID) | ORAL | 0 refills | Status: DC | PRN

## 2018-02-08 ENCOUNTER — Other Ambulatory Visit: Payer: Self-pay | Admitting: Orthopaedic Surgery

## 2018-02-09 MED ORDER — HYDROCODONE-ACETAMINOPHEN 5-325 MG PO TABS: 1.0000 | ORAL_TABLET | Freq: Four times a day (QID) | ORAL | 0 refills | Status: DC | PRN

## 2018-03-07 ENCOUNTER — Other Ambulatory Visit: Payer: Self-pay | Admitting: Orthopaedic Surgery

## 2018-03-08 ENCOUNTER — Other Ambulatory Visit: Payer: Self-pay | Admitting: Orthopedic Surgery

## 2018-03-08 DIAGNOSIS — G8929 Other chronic pain: Secondary | ICD-10-CM

## 2018-03-08 DIAGNOSIS — M546 Pain in thoracic spine: Principal | ICD-10-CM

## 2018-03-08 MED ORDER — HYDROCODONE-ACETAMINOPHEN 5-325 MG PO TABS: 1.0000 | ORAL_TABLET | Freq: Four times a day (QID) | ORAL | 0 refills | Status: DC | PRN

## 2018-04-05 ENCOUNTER — Other Ambulatory Visit: Payer: Self-pay | Admitting: Orthopedic Surgery

## 2018-04-05 DIAGNOSIS — G8929 Other chronic pain: Secondary | ICD-10-CM

## 2018-04-05 DIAGNOSIS — M546 Pain in thoracic spine: Principal | ICD-10-CM

## 2018-04-06 MED ORDER — HYDROCODONE-ACETAMINOPHEN 5-325 MG PO TABS: 1.0000 | ORAL_TABLET | Freq: Four times a day (QID) | ORAL | 0 refills | Status: AC | PRN

## 2018-05-05 ENCOUNTER — Ambulatory Visit: Payer: Self-pay | Admitting: Orthopaedic Surgery

## 2018-05-12 ENCOUNTER — Ambulatory Visit: Payer: Self-pay | Admitting: Orthopaedic Surgery

## 2018-05-12 ENCOUNTER — Encounter: Payer: Self-pay | Admitting: Orthopaedic Surgery
